# Patient Record
Sex: Male | Born: 1985 | Race: White | Hispanic: No | Marital: Married | Smoking: Never smoker
Health system: Southern US, Community
[De-identification: ages and names within clinical notes are randomized; demographics above are authoritative.]

## PROBLEM LIST (undated history)

## (undated) DIAGNOSIS — Z789 Other specified health status: Secondary | ICD-10-CM

---

## 2014-01-14 ENCOUNTER — Encounter (HOSPITAL_COMMUNITY): Payer: Self-pay | Admitting: Emergency Medicine

## 2014-01-14 ENCOUNTER — Encounter (HOSPITAL_COMMUNITY): Admitting: Certified Registered"

## 2014-01-14 ENCOUNTER — Inpatient Hospital Stay (HOSPITAL_COMMUNITY): Admitting: Certified Registered"

## 2014-01-14 ENCOUNTER — Inpatient Hospital Stay (HOSPITAL_COMMUNITY)
Admission: EM | Admit: 2014-01-14 | Discharge: 2014-01-22 | DRG: 957 | Disposition: A | Discharge status: Home / Self Care | Attending: Surgery | Admitting: Surgery

## 2014-01-14 ENCOUNTER — Inpatient Hospital Stay (HOSPITAL_COMMUNITY)

## 2014-01-14 ENCOUNTER — Emergency Department (HOSPITAL_COMMUNITY)

## 2014-01-14 ENCOUNTER — Encounter (HOSPITAL_COMMUNITY): Admission: EM | Disposition: A | Discharge status: Home / Self Care | Payer: Self-pay | Source: Home / Self Care

## 2014-01-14 DIAGNOSIS — S2502XA Major laceration of thoracic aorta, initial encounter: Secondary | ICD-10-CM

## 2014-01-14 DIAGNOSIS — R079 Chest pain, unspecified: Secondary | ICD-10-CM | POA: Diagnosis present

## 2014-01-14 DIAGNOSIS — S1093XA Contusion of unspecified part of neck, initial encounter: Secondary | ICD-10-CM

## 2014-01-14 DIAGNOSIS — S27329A Contusion of lung, unspecified, initial encounter: Secondary | ICD-10-CM | POA: Diagnosis present

## 2014-01-14 DIAGNOSIS — S2500XA Unspecified injury of thoracic aorta, initial encounter: Secondary | ICD-10-CM | POA: Diagnosis present

## 2014-01-14 DIAGNOSIS — S2242XA Multiple fractures of ribs, left side, initial encounter for closed fracture: Secondary | ICD-10-CM | POA: Diagnosis present

## 2014-01-14 DIAGNOSIS — J939 Pneumothorax, unspecified: Secondary | ICD-10-CM

## 2014-01-14 DIAGNOSIS — S27819A Unspecified injury of esophagus (thoracic part), initial encounter: Secondary | ICD-10-CM | POA: Diagnosis present

## 2014-01-14 DIAGNOSIS — S025XXA Fracture of tooth (traumatic), initial encounter for closed fracture: Secondary | ICD-10-CM | POA: Diagnosis present

## 2014-01-14 DIAGNOSIS — D62 Acute posthemorrhagic anemia: Secondary | ICD-10-CM | POA: Diagnosis present

## 2014-01-14 DIAGNOSIS — S22009A Unspecified fracture of unspecified thoracic vertebra, initial encounter for closed fracture: Secondary | ICD-10-CM | POA: Diagnosis present

## 2014-01-14 DIAGNOSIS — S0003XA Contusion of scalp, initial encounter: Secondary | ICD-10-CM | POA: Diagnosis present

## 2014-01-14 DIAGNOSIS — S272XXA Traumatic hemopneumothorax, initial encounter: Secondary | ICD-10-CM | POA: Diagnosis present

## 2014-01-14 DIAGNOSIS — G9340 Encephalopathy, unspecified: Secondary | ICD-10-CM | POA: Diagnosis present

## 2014-01-14 DIAGNOSIS — R000 Tachycardia, unspecified: Secondary | ICD-10-CM | POA: Diagnosis present

## 2014-01-14 DIAGNOSIS — I71019 Dissection of thoracic aorta, unspecified: Secondary | ICD-10-CM

## 2014-01-14 DIAGNOSIS — S4992XA Unspecified injury of left shoulder and upper arm, initial encounter: Secondary | ICD-10-CM | POA: Diagnosis present

## 2014-01-14 DIAGNOSIS — J9383 Other pneumothorax: Secondary | ICD-10-CM | POA: Diagnosis present

## 2014-01-14 DIAGNOSIS — I7101 Dissection of thoracic aorta: Secondary | ICD-10-CM

## 2014-01-14 DIAGNOSIS — S0083XA Contusion of other part of head, initial encounter: Secondary | ICD-10-CM | POA: Diagnosis present

## 2014-01-14 DIAGNOSIS — S3500XA Unspecified injury of abdominal aorta, initial encounter: Secondary | ICD-10-CM | POA: Diagnosis present

## 2014-01-14 DIAGNOSIS — S2249XA Multiple fractures of ribs, unspecified side, initial encounter for closed fracture: Secondary | ICD-10-CM | POA: Diagnosis present

## 2014-01-14 DIAGNOSIS — J90 Pleural effusion, not elsewhere classified: Secondary | ICD-10-CM | POA: Diagnosis present

## 2014-01-14 DIAGNOSIS — IMO0002 Reserved for concepts with insufficient information to code with codable children: Secondary | ICD-10-CM | POA: Diagnosis present

## 2014-01-14 DIAGNOSIS — S27321A Contusion of lung, unilateral, initial encounter: Secondary | ICD-10-CM | POA: Diagnosis present

## 2014-01-14 HISTORY — PX: ENDOVASCULAR STENT INSERTION: SHX5161

## 2014-01-14 HISTORY — DX: Other specified health status: Z78.9

## 2014-01-14 HISTORY — PX: ESOPHAGOGASTRODUODENOSCOPY: SHX5428

## 2014-01-14 LAB — URINALYSIS, ROUTINE W REFLEX MICROSCOPIC
Bilirubin Urine: NEGATIVE
Glucose, UA: NEGATIVE mg/dL
Ketones, ur: NEGATIVE mg/dL
LEUKOCYTES UA: NEGATIVE
NITRITE: NEGATIVE
Protein, ur: 30 mg/dL — AB
SPECIFIC GRAVITY, URINE: 1.042 — AB (ref 1.005–1.030)
UROBILINOGEN UA: 0.2 mg/dL (ref 0.0–1.0)
pH: 6 (ref 5.0–8.0)

## 2014-01-14 LAB — CBC
HCT: 39.5 % (ref 39.0–52.0)
HCT: 31.9 % — ABNORMAL LOW (ref 39.0–52.0)
HEMOGLOBIN: 11.2 g/dL — AB (ref 13.0–17.0)
Hemoglobin: 13.9 g/dL (ref 13.0–17.0)
MCH: 29.6 pg (ref 26.0–34.0)
MCH: 29.7 pg (ref 26.0–34.0)
MCHC: 35.2 g/dL (ref 30.0–36.0)
MCHC: 35.1 g/dL (ref 30.0–36.0)
MCV: 84.2 fL (ref 78.0–100.0)
MCV: 84.6 fL (ref 78.0–100.0)
PLATELETS: 161 10*3/uL (ref 150–400)
Platelets: 206 10*3/uL (ref 150–400)
RBC: 4.69 MIL/uL (ref 4.22–5.81)
RBC: 3.77 MIL/uL — AB (ref 4.22–5.81)
RDW: 12.2 % (ref 11.5–15.5)
RDW: 12.2 % (ref 11.5–15.5)
WBC: 13.3 10*3/uL — ABNORMAL HIGH (ref 4.0–10.5)
WBC: 18.7 10*3/uL — ABNORMAL HIGH (ref 4.0–10.5)

## 2014-01-14 LAB — POCT I-STAT 7, (LYTES, BLD GAS, ICA,H+H)
Bicarbonate: 25.7 meq/L — ABNORMAL HIGH (ref 20.0–24.0)
CALCIUM ION: 1.19 mmol/L (ref 1.12–1.23)
HEMATOCRIT: 30 % — AB (ref 39.0–52.0)
Hemoglobin: 10.2 g/dL — ABNORMAL LOW (ref 13.0–17.0)
O2 Saturation: 100 %
PH ART: 7.361 (ref 7.350–7.450)
Potassium: 4.6 mEq/L (ref 3.7–5.3)
SODIUM: 137 meq/L (ref 137–147)
TCO2: 27 mmol/L (ref 0–100)
pCO2 arterial: 45.3 mmHg — ABNORMAL HIGH (ref 35.0–45.0)
pO2, Arterial: 502 mmHg — ABNORMAL HIGH (ref 80.0–100.0)

## 2014-01-14 LAB — URINE MICROSCOPIC-ADD ON

## 2014-01-14 LAB — ABO/RH: ABO/RH(D): A POS

## 2014-01-14 LAB — COMPREHENSIVE METABOLIC PANEL
ALT: 78 U/L — ABNORMAL HIGH (ref 0–53)
AST: 66 U/L — ABNORMAL HIGH (ref 0–37)
Albumin: 3.7 g/dL (ref 3.5–5.2)
Alkaline Phosphatase: 48 U/L (ref 39–117)
Anion gap: 18 — ABNORMAL HIGH (ref 5–15)
BUN: 20 mg/dL (ref 6–23)
CO2: 22 mEq/L (ref 19–32)
Calcium: 9.2 mg/dL (ref 8.4–10.5)
Chloride: 99 mEq/L (ref 96–112)
Creatinine, Ser: 1.21 mg/dL (ref 0.50–1.35)
GFR calc Af Amer: >90 mL/min (ref >90)
GFR calc non Af Amer: 81 mL/min — ABNORMAL LOW (ref >90)
Glucose, Bld: 159 mg/dL — ABNORMAL HIGH (ref 70–99)
Potassium: 3.1 mEq/L — ABNORMAL LOW (ref 3.7–5.3)
Sodium: 139 mEq/L (ref 137–147)
Total Bilirubin: 0.3 mg/dL (ref 0.3–1.2)
Total Protein: 6.5 g/dL (ref 6.0–8.3)

## 2014-01-14 LAB — BASIC METABOLIC PANEL
Anion gap: 11 (ref 5–15)
BUN: 20 mg/dL (ref 6–23)
CO2: 24 mEq/L (ref 19–32)
Calcium: 8.2 mg/dL — ABNORMAL LOW (ref 8.4–10.5)
Chloride: 102 mEq/L (ref 96–112)
Creatinine, Ser: 1.05 mg/dL (ref 0.50–1.35)
GFR calc Af Amer: >90 mL/min (ref >90)
GFR calc non Af Amer: >90 mL/min (ref >90)
GLUCOSE: 162 mg/dL — AB (ref 70–99)
POTASSIUM: 4.6 meq/L (ref 3.7–5.3)
SODIUM: 137 meq/L (ref 137–147)

## 2014-01-14 LAB — PREPARE FRESH FROZEN PLASMA

## 2014-01-14 LAB — PROTIME-INR
INR: 1.1 (ref 0.00–1.49)
INR: 1.29 (ref 0.00–1.49)
Prothrombin Time: 14.2 seconds (ref 11.6–15.2)
Prothrombin Time: 16.1 seconds — ABNORMAL HIGH (ref 11.6–15.2)

## 2014-01-14 LAB — I-STAT CG4 LACTIC ACID, ED: Lactic Acid, Venous: 5.66 mmol/L — ABNORMAL HIGH (ref 0.5–2.2)

## 2014-01-14 LAB — MAGNESIUM: Magnesium: 1.6 mg/dL (ref 1.5–2.5)

## 2014-01-14 LAB — ETHANOL: Alcohol, Ethyl (B): <11 mg/dL (ref 0–11)

## 2014-01-14 LAB — APTT: aPTT: 32 seconds (ref 24–37)

## 2014-01-14 LAB — CDS SEROLOGY

## 2014-01-14 SURGERY — ENDOVASCULAR STENT GRAFT INSERTION
Anesthesia: General | Site: Mouth

## 2014-01-14 MED ORDER — ONDANSETRON 8 MG/NS 50 ML IVPB
8.0000 mg | Freq: Once | INTRAVENOUS | Status: DC
  Filled 2014-01-14: qty 8

## 2014-01-14 MED ORDER — ONDANSETRON HCL 4 MG/2ML IJ SOLN
4.0000 mg | Freq: Four times a day (QID) | INTRAMUSCULAR | Status: DC | PRN
  Administered 2014-01-20: 4 mg via INTRAVENOUS
  Filled 2014-01-14: qty 2

## 2014-01-14 MED ORDER — SODIUM CHLORIDE 0.9 % IV BOLUS (SEPSIS)
1000.0000 mL | Freq: Once | INTRAVENOUS | Status: AC
  Administered 2014-01-14: 1000 mL via INTRAVENOUS

## 2014-01-14 MED ORDER — LIDOCAINE HCL (CARDIAC) 20 MG/ML IV SOLN
INTRAVENOUS | Status: AC
  Filled 2014-01-14: qty 5

## 2014-01-14 MED ORDER — POTASSIUM CHLORIDE CRYS ER 20 MEQ PO TBCR: 20.0000 meq | EXTENDED_RELEASE_TABLET | Freq: Every day | ORAL | Status: DC | PRN

## 2014-01-14 MED ORDER — CEFAZOLIN SODIUM-DEXTROSE 2-3 GM-% IV SOLR
INTRAVENOUS | Status: AC
  Filled 2014-01-14: qty 50

## 2014-01-14 MED ORDER — VECURONIUM BROMIDE 10 MG IV SOLR
INTRAVENOUS | Status: DC | PRN
  Administered 2014-01-14: 2 mg via INTRAVENOUS

## 2014-01-14 MED ORDER — ETOMIDATE 2 MG/ML IV SOLN
INTRAVENOUS | Status: DC | PRN
  Administered 2014-01-14: 20 mg via INTRAVENOUS

## 2014-01-14 MED ORDER — IODIXANOL 320 MG/ML IV SOLN
INTRAVENOUS | Status: DC | PRN
  Administered 2014-01-14: 60.2 mL via INTRAVENOUS

## 2014-01-14 MED ORDER — ONDANSETRON HCL 4 MG/2ML IJ SOLN
INTRAMUSCULAR | Status: AC
  Administered 2014-01-14: 8 mg
  Filled 2014-01-14: qty 4

## 2014-01-14 MED ORDER — DEXTROSE 5 % IV SOLN
1.5000 g | Freq: Two times a day (BID) | INTRAVENOUS | Status: AC
  Administered 2014-01-14 – 2014-01-15 (×2): 1.5 g via INTRAVENOUS
  Filled 2014-01-14 (×2): qty 1.5

## 2014-01-14 MED ORDER — KCL IN DEXTROSE-NACL 40-5-0.45 MEQ/L-%-% IV SOLN
INTRAVENOUS | Status: DC
  Administered 2014-01-14: 22:00:00 via INTRAVENOUS
  Filled 2014-01-14 (×3): qty 1000

## 2014-01-14 MED ORDER — MUPIROCIN 2 % EX OINT: TOPICAL_OINTMENT | Freq: Two times a day (BID) | CUTANEOUS | Status: DC

## 2014-01-14 MED ORDER — IOHEXOL 300 MG/ML  SOLN: 100.0000 mL | Freq: Once | INTRAMUSCULAR | Status: AC | PRN

## 2014-01-14 MED ORDER — SODIUM CHLORIDE 0.9 % IV SOLN
INTRAVENOUS | Status: DC | PRN
  Administered 2014-01-14: 18:00:00 via INTRAVENOUS

## 2014-01-14 MED ORDER — HEPARIN SODIUM (PORCINE) 1000 UNIT/ML IJ SOLN
INTRAMUSCULAR | Status: AC
  Filled 2014-01-14: qty 1

## 2014-01-14 MED ORDER — GUAIFENESIN-DM 100-10 MG/5ML PO SYRP: 15.0000 mL | ORAL_SOLUTION | ORAL | Status: DC | PRN

## 2014-01-14 MED ORDER — ARTIFICIAL TEARS OP OINT
TOPICAL_OINTMENT | OPHTHALMIC | Status: AC
  Filled 2014-01-14: qty 3.5

## 2014-01-14 MED ORDER — FENTANYL CITRATE 0.05 MG/ML IJ SOLN
100.0000 ug | INTRAMUSCULAR | Status: DC | PRN
  Administered 2014-01-14 – 2014-01-17 (×16): 100 ug via INTRAVENOUS
  Filled 2014-01-14 (×14): qty 2

## 2014-01-14 MED ORDER — DOPAMINE-DEXTROSE 3.2-5 MG/ML-% IV SOLN: 3.0000 ug/kg/min | INTRAVENOUS | Status: DC

## 2014-01-14 MED ORDER — PROTAMINE SULFATE 10 MG/ML IV SOLN
INTRAVENOUS | Status: DC | PRN
  Administered 2014-01-14 (×3): 10 mg via INTRAVENOUS

## 2014-01-14 MED ORDER — ONDANSETRON HCL 4 MG PO TABS: 4.0000 mg | ORAL_TABLET | Freq: Four times a day (QID) | ORAL | Status: DC | PRN

## 2014-01-14 MED ORDER — ROCURONIUM BROMIDE 100 MG/10ML IV SOLN
INTRAVENOUS | Status: DC | PRN
  Administered 2014-01-14: 50 mg via INTRAVENOUS

## 2014-01-14 MED ORDER — MIDAZOLAM HCL 2 MG/2ML IJ SOLN
INTRAMUSCULAR | Status: AC
  Filled 2014-01-14: qty 2

## 2014-01-14 MED ORDER — ONDANSETRON HCL 4 MG/2ML IJ SOLN: 4.0000 mg | Freq: Four times a day (QID) | INTRAMUSCULAR | Status: DC | PRN

## 2014-01-14 MED ORDER — PHENOL 1.4 % MT LIQD: 1.0000 | OROMUCOSAL | Status: DC | PRN

## 2014-01-14 MED ORDER — SODIUM CHLORIDE 0.9 % IV SOLN: 500.0000 mL | Freq: Once | INTRAVENOUS | Status: AC | PRN

## 2014-01-14 MED ORDER — SODIUM CHLORIDE 0.9 % IV SOLN: INTRAVENOUS | Status: DC

## 2014-01-14 MED ORDER — LABETALOL HCL 5 MG/ML IV SOLN
10.0000 mg | INTRAVENOUS | Status: DC | PRN
  Filled 2014-01-14: qty 4

## 2014-01-14 MED ORDER — LABETALOL HCL 5 MG/ML IV SOLN
INTRAVENOUS | Status: DC | PRN
  Administered 2014-01-14: 5 mg via INTRAVENOUS

## 2014-01-14 MED ORDER — MAGNESIUM SULFATE 40 MG/ML IJ SOLN
2.0000 g | Freq: Every day | INTRAMUSCULAR | Status: AC | PRN
  Administered 2014-01-14: 2 g via INTRAVENOUS
  Filled 2014-01-14: qty 50

## 2014-01-14 MED ORDER — FENTANYL CITRATE 0.05 MG/ML IJ SOLN
INTRAMUSCULAR | Status: DC | PRN
  Administered 2014-01-14: 200 ug via INTRAVENOUS
  Administered 2014-01-14: 50 ug via INTRAVENOUS

## 2014-01-14 MED ORDER — TETANUS-DIPHTH-ACELL PERTUSSIS 5-2.5-18.5 LF-MCG/0.5 IM SUSP
0.5000 mL | Freq: Once | INTRAMUSCULAR | Status: AC
  Administered 2014-01-14: 0.5 mL via INTRAMUSCULAR
  Filled 2014-01-14: qty 0.5

## 2014-01-14 MED ORDER — NEOSTIGMINE METHYLSULFATE 10 MG/10ML IV SOLN
INTRAVENOUS | Status: DC | PRN
  Administered 2014-01-14: 4 mg via INTRAVENOUS

## 2014-01-14 MED ORDER — CEFAZOLIN SODIUM-DEXTROSE 2-3 GM-% IV SOLR
INTRAVENOUS | Status: DC | PRN
  Administered 2014-01-14: 2 g via INTRAVENOUS

## 2014-01-14 MED ORDER — ARTIFICIAL TEARS OP OINT
TOPICAL_OINTMENT | OPHTHALMIC | Status: DC | PRN
  Administered 2014-01-14: 1 via OPHTHALMIC

## 2014-01-14 MED ORDER — SUCCINYLCHOLINE CHLORIDE 20 MG/ML IJ SOLN
INTRAMUSCULAR | Status: DC | PRN
  Administered 2014-01-14: 120 mg via INTRAVENOUS

## 2014-01-14 MED ORDER — FENTANYL CITRATE 0.05 MG/ML IJ SOLN
100.0000 ug | Freq: Once | INTRAMUSCULAR | Status: AC
Start: 2014-01-14 — End: 2014-01-14
  Administered 2014-01-14: 100 ug via INTRAVENOUS

## 2014-01-14 MED ORDER — FENTANYL CITRATE 0.05 MG/ML IJ SOLN
INTRAMUSCULAR | Status: AC
  Filled 2014-01-14: qty 5

## 2014-01-14 MED ORDER — LACTATED RINGERS IV SOLN
INTRAVENOUS | Status: DC | PRN
  Administered 2014-01-14 (×2): via INTRAVENOUS

## 2014-01-14 MED ORDER — HEPARIN SODIUM (PORCINE) 1000 UNIT/ML IJ SOLN
INTRAMUSCULAR | Status: DC | PRN
  Administered 2014-01-14: 8 mL via INTRAVENOUS

## 2014-01-14 MED ORDER — DEXAMETHASONE SODIUM PHOSPHATE 4 MG/ML IJ SOLN
INTRAMUSCULAR | Status: DC | PRN
  Administered 2014-01-14: 8 mg via INTRAVENOUS

## 2014-01-14 MED ORDER — MIDAZOLAM HCL 2 MG/2ML IJ SOLN
2.0000 mg | Freq: Once | INTRAMUSCULAR | Status: AC
  Administered 2014-01-14: 2 mg via INTRAVENOUS

## 2014-01-14 MED ORDER — PANTOPRAZOLE SODIUM 40 MG PO TBEC
40.0000 mg | DELAYED_RELEASE_TABLET | Freq: Every day | ORAL | Status: DC
  Administered 2014-01-15: 40 mg via ORAL
  Filled 2014-01-14 (×2): qty 1

## 2014-01-14 MED ORDER — GLYCOPYRROLATE 0.2 MG/ML IJ SOLN
INTRAMUSCULAR | Status: DC | PRN
  Administered 2014-01-14: 0.6 mg via INTRAVENOUS

## 2014-01-14 MED ORDER — METOPROLOL TARTRATE 1 MG/ML IV SOLN: 2.0000 mg | INTRAVENOUS | Status: DC | PRN

## 2014-01-14 MED ORDER — FENTANYL CITRATE 0.05 MG/ML IJ SOLN
25.0000 ug | INTRAMUSCULAR | Status: DC | PRN
  Administered 2014-01-14: 25 ug via INTRAVENOUS
  Administered 2014-01-15: 100 ug via INTRAVENOUS
  Administered 2014-01-15: 25 ug via INTRAVENOUS
  Administered 2014-01-15: 50 ug via INTRAVENOUS
  Administered 2014-01-16: 100 ug via INTRAVENOUS
  Administered 2014-01-16: 50 ug via INTRAVENOUS
  Filled 2014-01-14 (×8): qty 2

## 2014-01-14 MED ORDER — PHENYLEPHRINE HCL 10 MG/ML IJ SOLN
10.0000 mg | INTRAVENOUS | Status: DC | PRN
  Administered 2014-01-14: 10 ug/min via INTRAVENOUS

## 2014-01-14 MED ORDER — 0.9 % SODIUM CHLORIDE (POUR BTL) OPTIME
TOPICAL | Status: DC | PRN
  Administered 2014-01-14: 1000 mL

## 2014-01-14 MED ORDER — LIDOCAINE HCL (CARDIAC) 20 MG/ML IV SOLN
INTRAVENOUS | Status: DC | PRN
  Administered 2014-01-14: 100 mg via INTRAVENOUS

## 2014-01-14 MED ORDER — ENOXAPARIN SODIUM 40 MG/0.4ML ~~LOC~~ SOLN
40.0000 mg | SUBCUTANEOUS | Status: DC
  Administered 2014-01-15 – 2014-01-17 (×3): 40 mg via SUBCUTANEOUS
  Filled 2014-01-14 (×4): qty 0.4

## 2014-01-14 MED ORDER — LACTATED RINGERS IV SOLN
INTRAVENOUS | Status: DC | PRN
  Administered 2014-01-14: 18:00:00 via INTRAVENOUS

## 2014-01-14 MED ORDER — HYDRALAZINE HCL 20 MG/ML IJ SOLN: 10.0000 mg | INTRAMUSCULAR | Status: DC | PRN

## 2014-01-14 MED ORDER — ETOMIDATE 2 MG/ML IV SOLN
INTRAVENOUS | Status: AC
  Filled 2014-01-14: qty 10

## 2014-01-14 MED ORDER — SODIUM CHLORIDE 0.9 % IR SOLN
Status: DC | PRN
  Administered 2014-01-14: 19:00:00

## 2014-01-14 MED ORDER — MORPHINE SULFATE 2 MG/ML IJ SOLN
2.0000 mg | INTRAMUSCULAR | Status: DC | PRN
  Administered 2014-01-15: 4 mg via INTRAVENOUS
  Administered 2014-01-15: 2 mg via INTRAVENOUS
  Filled 2014-01-14: qty 1
  Filled 2014-01-14: qty 2

## 2014-01-14 MED ORDER — ONDANSETRON HCL 4 MG/2ML IJ SOLN
INTRAMUSCULAR | Status: DC | PRN
  Administered 2014-01-14: 4 mg via INTRAVENOUS

## 2014-01-14 MED ORDER — PANTOPRAZOLE SODIUM 40 MG IV SOLR
40.0000 mg | Freq: Every day | INTRAVENOUS | Status: DC
  Administered 2014-01-14 – 2014-01-16 (×2): 40 mg via INTRAVENOUS
  Filled 2014-01-14 (×4): qty 40

## 2014-01-14 SURGICAL SUPPLY — 66 items
BAG DECANTER FOR FLEXI CONT (MISCELLANEOUS) IMPLANT
BALLN CODA OCL 2-9.0-35-120-3 (BALLOONS)
BALLOON COD OCL 2-9.0-35-120-3 (BALLOONS) IMPLANT
CANISTER SUCTION 2500CC (MISCELLANEOUS) ×4 IMPLANT
CATH ACCU-VU SIZ PIG 5F 100CM (CATHETERS) ×4 IMPLANT
CATH BALLN TRILOBE 26-42 (BALLOONS) ×4 IMPLANT
CLIP TI MEDIUM 24 (CLIP) IMPLANT
CLIP TI WIDE RED SMALL 24 (CLIP) IMPLANT
COVER MAYO STAND STRL (DRAPES) ×4 IMPLANT
COVER SURGICAL LIGHT HANDLE (MISCELLANEOUS) ×4 IMPLANT
DERMABOND ADHESIVE PROPEN (GAUZE/BANDAGES/DRESSINGS) ×2
DERMABOND ADVANCED (GAUZE/BANDAGES/DRESSINGS) ×2
DERMABOND ADVANCED .7 DNX12 (GAUZE/BANDAGES/DRESSINGS) ×2 IMPLANT
DERMABOND ADVANCED .7 DNX6 (GAUZE/BANDAGES/DRESSINGS) ×2 IMPLANT
DEVICE CLOSURE PERCLS PRGLD 6F (VASCULAR PRODUCTS) ×4 IMPLANT
DRAIN CHANNEL 10F 3/8 F FF (DRAIN) IMPLANT
DRAIN CHANNEL 10M FLAT 3/4 FLT (DRAIN) IMPLANT
DRAPE TABLE COVER HEAVY DUTY (DRAPES) ×4 IMPLANT
ELECT CAUTERY BLADE 6.4 (BLADE) ×4 IMPLANT
ELECT REM PT RETURN 9FT ADLT (ELECTROSURGICAL) ×8
ELECTRODE REM PT RTRN 9FT ADLT (ELECTROSURGICAL) ×4 IMPLANT
ENDOPROSTHESIS THORAC 26X26X10 (Endovascular Graft) ×2 IMPLANT
ENDOPROTH THORACIC 26X26X10 (Endovascular Graft) ×4 IMPLANT
EVACUATOR 3/16  PVC DRAIN (DRAIN)
EVACUATOR 3/16 PVC DRAIN (DRAIN) IMPLANT
EVACUATOR SILICONE 100CC (DRAIN) IMPLANT
GAUZE SPONGE 2X2 8PLY STRL LF (GAUZE/BANDAGES/DRESSINGS) ×2 IMPLANT
GLOVE BIO SURGEON STRL SZ 6.5 (GLOVE) ×3 IMPLANT
GLOVE BIO SURGEON STRL SZ7 (GLOVE) ×4 IMPLANT
GLOVE BIO SURGEONS STRL SZ 6.5 (GLOVE) ×1
GLOVE BIOGEL PI IND STRL 7.5 (GLOVE) ×2 IMPLANT
GLOVE BIOGEL PI INDICATOR 7.5 (GLOVE) ×2
GOWN STRL REUS W/ TWL LRG LVL3 (GOWN DISPOSABLE) ×6 IMPLANT
GOWN STRL REUS W/TWL LRG LVL3 (GOWN DISPOSABLE) ×6
KIT BASIN OR (CUSTOM PROCEDURE TRAY) ×4 IMPLANT
KIT ROOM TURNOVER OR (KITS) ×4 IMPLANT
NEEDLE PERC 18GX7CM (NEEDLE) ×8 IMPLANT
NS IRRIG 1000ML POUR BTL (IV SOLUTION) ×8 IMPLANT
PACK AORTA (CUSTOM PROCEDURE TRAY) ×4 IMPLANT
PAD ARMBOARD 7.5X6 YLW CONV (MISCELLANEOUS) ×8 IMPLANT
PENCIL BUTTON HOLSTER BLD 10FT (ELECTRODE) IMPLANT
PERCLOSE PROGLIDE 6F (VASCULAR PRODUCTS) ×8
PROTECTION STATION PRESSURIZED (MISCELLANEOUS) ×4
SHEATH DRYSEAL GORE 20X28 (SHEATH) ×4 IMPLANT
SPONGE GAUZE 2X2 STER 10/PKG (GAUZE/BANDAGES/DRESSINGS) ×2
STAPLER VISISTAT 35W (STAPLE) IMPLANT
STATION PROTECTION PRESSURIZED (MISCELLANEOUS) ×2 IMPLANT
STOPCOCK MORSE 400PSI 3WAY (MISCELLANEOUS) ×4 IMPLANT
SUT ETHILON 3 0 PS 1 (SUTURE) IMPLANT
SUT MNCRL AB 4-0 PS2 18 (SUTURE) ×12 IMPLANT
SUT PROLENE 5 0 C 1 24 (SUTURE) IMPLANT
SUT VIC AB 2-0 CTX 36 (SUTURE) IMPLANT
SUT VIC AB 3-0 SH 27 (SUTURE)
SUT VIC AB 3-0 SH 27X BRD (SUTURE) IMPLANT
SYR 20CC LL (SYRINGE) ×8 IMPLANT
SYR 30ML LL (SYRINGE) IMPLANT
SYR MEDRAD MARK V 150ML (SYRINGE) ×4 IMPLANT
SYRINGE 10CC LL (SYRINGE) ×12 IMPLANT
TAPE CLOTH SURG 4X10 WHT LF (GAUZE/BANDAGES/DRESSINGS) ×4 IMPLANT
TOWEL OR 17X24 6PK STRL BLUE (TOWEL DISPOSABLE) ×8 IMPLANT
TOWEL OR 17X26 10 PK STRL BLUE (TOWEL DISPOSABLE) ×8 IMPLANT
TRAY FOLEY CATH 16FRSI W/METER (SET/KITS/TRAYS/PACK) ×4 IMPLANT
TUBING HIGH PRESSURE 120CM (CONNECTOR) ×4 IMPLANT
WATER STERILE IRR 1000ML POUR (IV SOLUTION) ×4 IMPLANT
WIRE BENTSON .035X145CM (WIRE) ×4 IMPLANT
WIRE STIFF LUNDERQUIST 260CM (WIRE) ×4 IMPLANT
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
## 2014-01-14 NOTE — ED Notes (Signed)
Pain med given.  Or has called   They are ready for this pt.
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
## 2014-01-14 NOTE — ED Notes (Addendum)
Dr gerhardt back in to see.  Endoscopy permit signed for following surgery
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
## 2014-01-14 NOTE — ED Notes (Signed)
Anesthesia here to insert bi-lateral art lines
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
## 2014-01-14 NOTE — Anesthesia Postprocedure Evaluation (Signed)
  Anesthesia Post-op Note  Patient: Kenneth Franco  Procedure(s) Performed: Procedure(s) with comments: ENDOVASCULAR STENT GRAFT INSERTION (N/A) - Thorasic Endovascular Stent. ESOPHAGOGASTRODUODENOSCOPY (EGD) (N/A)  Patient Location: PACU  Anesthesia Type:General  Level of Consciousness: awake and alert   Airway and Oxygen Therapy: Patient Spontanous Breathing  Post-op Pain: none  Post-op Assessment: Post-op Vital signs reviewed, Patient's Cardiovascular Status Stable and Respiratory Function Stable  Post-op Vital Signs: Reviewed  Filed Vitals:   01/14/14 2110  BP:   Pulse:   Temp: 37.1 C  Resp:     Complications: No apparent anesthesia complications
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
## 2014-01-14 NOTE — ED Notes (Signed)
Primary RN, Myrtha Mantis, has remained with the pt for the duration of his stay to this point, bedside report given to Herbert Deaner, RN.  RN stayed with pt to and from CT and monitored continuously

## 2014-01-14 NOTE — ED Notes (Signed)
Anesthesia coming to insert art line.

## 2014-01-14 NOTE — Anesthesia Procedure Notes (Addendum)
Procedure Name: Intubation Date/Time: 01/14/2014 6:06 PM Performed by: Edmonia Caprio Pre-anesthesia Checklist: Patient identified, Emergency Drugs available, Suction available, Patient being monitored and Timeout performed Patient Re-evaluated:Patient Re-evaluated prior to inductionOxygen Delivery Method: Circle system utilized Preoxygenation: Pre-oxygenation with 100% oxygen Intubation Type: Rapid sequence, IV induction and Cricoid Pressure applied Laryngoscope Size: Miller and 2 Grade View: Grade II Tube type: Subglottic suction tube Number of attempts: 2 Placement Confirmation: ETT inserted through vocal cords under direct vision,  positive ETCO2,  CO2 detector and breath sounds checked- equal and bilateral Secured at: 24 cm Tube secured with: Tape Dental Injury: Teeth and Oropharynx as per pre-operative assessment and Bloody posterior oropharynx       The patient was identified and consent obtained.  TO was performed, and full barrier precautions were used.  The skin was anesthetized with lidocaine.  Once the vein was located with the 22 ga., the wire was inserted into the vein. The insertion site was dilated and the CVL was carefully inserted and sutured in place. The patient tolerated the procedure well.Ultrasound guidance: relevant anatomy identified, needle position confirmed, vessel patent, wire seen inside the vessel.  Images printed for the medical record.  Start: 1713 End: 1723 J. Claybon Jabs, MD

## 2014-01-14 NOTE — Progress Notes (Signed)
   Tried to update the patient's wife, but no phone number is listed.  Reportedly patient's wife is enroute to [place].  Will try to talk to her tomorrow once a phone number is available.  Dr. Hart Rochester is on call for our group tonight.  Leonides Sake, MD [facility] Office: [phone] Pager: [phone]  01/14/2014, 7:50 PM

## 2014-01-14 NOTE — ED Notes (Signed)
Dr Jesusita Oka singer at bedside to insert central line

## 2014-01-14 NOTE — ED Notes (Signed)
Lactic acid results given to Dr. Kohut 
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
## 2014-01-14 NOTE — ED Notes (Signed)
Or permit signed by the pt

## 2014-01-14 NOTE — ED Notes (Signed)
Dr Imogene Burn at the bedside

## 2014-01-14 NOTE — Brief Op Note (Signed)
      [address]       [name] [address]             [phone]     01/14/2014  7:54 PM  PATIENT:  Kenneth Franco  28 y.o. male  PRE-OPERATIVE DIAGNOSIS:  Descending Aortic Tear  POST-OPERATIVE DIAGNOSIS:  Descending Aortic Tear  PROCEDURE:  Procedure(s) with comments: ENDOVASCULAR STENT GRAFT INSERTION (N/A) - Thorasic Endovascular Stent. ESOPHAGOGASTRODUODENOSCOPY (EGD) (N/A) PROCEDURE:  1. Cannulation of right common femoral artery under ultrasound guidance 2. Preclose repair of right common femoral artery  3. Placement of catheter in aorta 4. Arch aortogram 5. Endovascular repair of descending thoracic aorta, involving coverage of left subclavian artery (26 mm x 26 mm x 10 cm) 6. Radiology S&I 7. Upper endoscopy (done by Dr. Tyrone Sage) PRE-OPERATIVE DIAGNOSIS: Traumatic transection of descending thoracic aorta  POST-OPERATIVE DIAGNOSIS: same as above /no evidence of esophageal injury  CO-SURGEON: Leonides Sake, MD; Sheliah Plane, MD  ANESTHESIA: general  ESTIMATED BLOOD LOSS: 100 cc  FINDING(S):  1. Descending thoracic aortic transection distal to subclavian artery  2. Successful exclusion of injured segment with continued patency of innominate artery and left common carotid artery  3. Coverage of left subclavian artery with no type I endoleak evident: 40 mm Hg pressure still present in left radial arterial line 4. No type II endoleak evident  5. Palpable dorsalis pedis pulse in right foot   ANESTHESIA:   general  EBL:  Total I/O In: 1700 [I.V.:1700] Out: 400 [Urine:400]  BLOOD ADMINISTERED:none  DRAINS: none   LOCAL MEDICATIONS USED:  NONE  SPECIMEN:  No Specimen  DISPOSITION OF SPECIMEN:  N/A  COUNTS:  YES  DICTATION: .Dragon Dictation  PLAN OF CARE: Admit to inpatient   PATIENT DISPOSITION:  ICU - extubated and stable.   Delay start of Pharmacological VTE agent (>24hrs) due to surgical blood loss or risk of bleeding: yes

## 2014-01-14 NOTE — ED Notes (Signed)
Dr Hart Rochester at the bedside

## 2014-01-14 NOTE — Transfer of Care (Signed)
Immediate Anesthesia Transfer of Care Note  Patient: Kenneth Franco  Procedure(s) Performed: Procedure(s) with comments: ENDOVASCULAR STENT GRAFT INSERTION (N/A) - Thorasic Endovascular Stent. ESOPHAGOGASTRODUODENOSCOPY (EGD) (N/A)  Patient Location: PACU  Anesthesia Type:General  Level of Consciousness: awake and alert   Airway & Oxygen Therapy: Patient Spontanous Breathing and Patient connected to nasal cannula oxygen  Post-op Assessment: Report given to PACU RN, Post -op Vital signs reviewed and stable and Patient moving all extremities  Post vital signs: Reviewed and stable  Complications: No apparent anesthesia complications
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
## 2014-01-15 ENCOUNTER — Encounter (HOSPITAL_COMMUNITY): Payer: Self-pay | Admitting: Vascular Surgery

## 2014-01-15 LAB — BASIC METABOLIC PANEL
Anion gap: 17 — ABNORMAL HIGH (ref 5–15)
BUN: 16 mg/dL (ref 6–23)
CALCIUM: 8.4 mg/dL (ref 8.4–10.5)
CO2: 22 mEq/L (ref 19–32)
Chloride: 99 mEq/L (ref 96–112)
Creatinine, Ser: 0.98 mg/dL (ref 0.50–1.35)
GLUCOSE: 186 mg/dL — AB (ref 70–99)
Potassium: 4.9 mEq/L (ref 3.7–5.3)
Sodium: 138 mEq/L (ref 137–147)

## 2014-01-15 LAB — CBC
HCT: 31 % — ABNORMAL LOW (ref 39.0–52.0)
HEMATOCRIT: 28.1 % — AB (ref 39.0–52.0)
Hemoglobin: 10.7 g/dL — ABNORMAL LOW (ref 13.0–17.0)
Hemoglobin: 10 g/dL — ABNORMAL LOW (ref 13.0–17.0)
MCH: 28.8 pg (ref 26.0–34.0)
MCH: 29.6 pg (ref 26.0–34.0)
MCHC: 34.5 g/dL (ref 30.0–36.0)
MCHC: 35.6 g/dL (ref 30.0–36.0)
MCV: 83.3 fL (ref 78.0–100.0)
MCV: 83.1 fL (ref 78.0–100.0)
Platelets: 124 10*3/uL — ABNORMAL LOW (ref 150–400)
Platelets: 110 10*3/uL — ABNORMAL LOW (ref 150–400)
RBC: 3.72 MIL/uL — ABNORMAL LOW (ref 4.22–5.81)
RBC: 3.38 MIL/uL — ABNORMAL LOW (ref 4.22–5.81)
RDW: 12.1 % (ref 11.5–15.5)
RDW: 12.3 % (ref 11.5–15.5)
WBC: 10.4 10*3/uL (ref 4.0–10.5)
WBC: 11.9 10*3/uL — ABNORMAL HIGH (ref 4.0–10.5)

## 2014-01-15 LAB — TYPE AND SCREEN
ABO/RH(D): A POS
ABO/RH(D): A POS
Antibody Screen: NEGATIVE
Antibody Screen: NEGATIVE
Unit division: 0
Unit division: 0
Unit division: 0
Unit division: 0
Unit division: 0
Unit division: 0

## 2014-01-15 LAB — BLOOD PRODUCT ORDER (VERBAL) VERIFICATION

## 2014-01-15 LAB — URINE CULTURE
COLONY COUNT: NO GROWTH
Culture: NO GROWTH

## 2014-01-15 LAB — MRSA PCR SCREENING: MRSA BY PCR: NEGATIVE

## 2014-01-15 MED ORDER — TRAMADOL HCL 50 MG PO TABS
100.0000 mg | ORAL_TABLET | Freq: Four times a day (QID) | ORAL | Status: DC | PRN
  Administered 2014-01-16 (×3): 100 mg via ORAL
  Filled 2014-01-15 (×3): qty 2

## 2014-01-15 MED ORDER — DEXTROSE-NACL 5-0.45 % IV SOLN
INTRAVENOUS | Status: DC
  Administered 2014-01-15 – 2014-01-16 (×2): via INTRAVENOUS
  Filled 2014-01-15 (×5): qty 1000

## 2014-01-15 MED ORDER — METOPROLOL TARTRATE 12.5 MG HALF TABLET: 12.5000 mg | ORAL_TABLET | Freq: Two times a day (BID) | ORAL | Status: DC

## 2014-01-15 MED ORDER — METOPROLOL TARTRATE 12.5 MG HALF TABLET
12.5000 mg | ORAL_TABLET | Freq: Two times a day (BID) | ORAL | Status: DC
  Administered 2014-01-15 – 2014-01-22 (×15): 12.5 mg via ORAL
  Filled 2014-01-15 (×20): qty 1
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
## 2014-01-15 NOTE — Progress Notes (Signed)
UR completed.  Trace Wirick, RN BSN MHA CCM Trauma/Neuro ICU Case Manager [phone]  

## 2014-01-15 NOTE — Progress Notes (Signed)
Patient ID: Kenneth Franco, male   DOB: 26-Jun-1985, 28 y.o.   MRN: [id number] TCTS DAILY ICU PROGRESS NOTE                   [address]            [name] [address]          [phone]   1 Day Post-Op Procedure(s) (LRB): ENDOVASCULAR STENT GRAFT INSERTION (N/A) ESOPHAGOGASTRODUODENOSCOPY (EGD) (N/A)  Total Length of Stay:  LOS: 1 day   Subjective: Alert neuro intact, normal feeling lower extremities  Objective: Vital signs in last 24 hours: Temp:  [97.6 F (36.4 C)-100 F (37.8 C)] 97.6 F (36.4 C) (09/17 0400) Pulse Rate:  [75-117] 93 (09/17 0700) Cardiac Rhythm:  [-] Normal sinus rhythm (09/17 0400) Resp:  [9-100] 29 (09/17 0700) BP: (100-154)/(56-90) 136/64 mmHg (09/17 0700) SpO2:  [94 %-100 %] 97 % (09/17 0700) Arterial Line BP: (91-178)/(41-84) 171/67 mmHg (09/17 0700) Weight:  [210 lb (95.255 kg)-211 lb 9.6 oz (95.981 kg)] 211 lb 9.6 oz (95.981 kg) (09/16 2300)  Filed Weights   01/14/14 1445 01/14/14 2300  Weight: 210 lb (95.255 kg) 211 lb 9.6 oz (95.981 kg)    Weight change:    Hemodynamic parameters for last 24 hours:    Intake/Output from previous day: 09/16 0701 - 09/17 0700 In: 4581.7 [I.V.:4481.7; IV Piggyback:100] Out: 1415 [Urine:1370; Chest Tube:45]  Intake/Output this shift:    Current Meds: Scheduled Meds: . cefUROXime (ZINACEF)  IV  1.5 g Intravenous Q12H  . enoxaparin (LOVENOX) injection  40 mg Subcutaneous Q24H  . pantoprazole  40 mg Oral Daily   Or  . pantoprazole (PROTONIX) IV  40 mg Intravenous Daily   Continuous Infusions: . dextrose 5 % and 0.45% NaCl 1,000 mL infusion     PRN Meds:.fentaNYL, fentaNYL, guaiFENesin-dextromethorphan, hydrALAZINE, labetalol, metoprolol, morphine injection, ondansetron (ZOFRAN) IV, ondansetron, ondansetron, phenol, potassium chloride, traMADol  General appearance: alert, cooperative and no distress Neurologic: intact Heart: regular rate and rhythm, S1, S2 normal, no murmur,  click, rub or gallop Lungs: diminished breath sounds LLL and LUL Abdomen: soft, non-tender; bowel sounds normal; no masses,  no organomegaly Extremities: extremities normal, atraumatic, no cyanosis or edema and Homans sign is negative, no sign of DVT Wound: right groin with out hematoma  Lab Results: CBC: Recent Labs  01/14/14 2000 01/15/14 0410  WBC 18.7* 10.4  HGB 11.2* 10.7*  HCT 31.9* 31.0*  PLT 161 124*   BMET:  Recent Labs  01/14/14 2000 01/15/14 0410  NA 137 138  K 4.6 4.9  CL 102 99  CO2 24 22  GLUCOSE 162* 186*  BUN 20 16  CREATININE 1.05 0.98  CALCIUM 8.2* 8.4    PT/INR:  Recent Labs  01/14/14 2000  LABPROT 16.1*  INR 1.29     Assessment/Plan: S/P Procedure(s) (LRB): ENDOVASCULAR STENT GRAFT INSERTION (N/A) ESOPHAGOGASTRODUODENOSCOPY (EGD) (N/A) Stable after stent Start low dose beta blocker- short term     Lorielle Boehning B 01/15/2014 8:29 AM
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
## 2014-01-15 NOTE — Progress Notes (Addendum)
   Daily Progress Note  Assessment/Planning: POD #1 s/p TEVAR for DTA transection   TEVAR clinically appears patent  AM pCXR pending.   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  [facility] is probably nears [facility] MTF that provides this capability  Will continue to follow  Subjective  - 1 Day Post-Op  Sore all over  Objective Filed Vitals:   01/15/14 0400 01/15/14 0500 01/15/14 0600 01/15/14 0700  BP: 136/64 129/56 130/62 136/64  Pulse: 89 86 86 93  Temp: 97.6 F (36.4 C)     TempSrc: Oral     Resp: Height:      Weight:      SpO2: 100% 98% 98% 97%    Intake/Output Summary (Last 24 hours) at 01/15/14 0825 Last data filed at 01/15/14 0600  Gross per 24 hour  Intake 4581.67 ml  Output   1415 ml  Net 3166.67 ml   NEURO able to move all extremities spont., CN intact PULM  BLL , L chest tube in place, AM pCXR pending CV  RRR GI  soft, NTND VASC  R groin soft without hematoma, R DP easily palpable, CR in L hand <2 sec, well perfused hand  Laboratory CBC    Component Value Date/Time   WBC 10.4 01/15/2014 0410   HGB 10.7* 01/15/2014 0410   HCT 31.0* 01/15/2014 0410   PLT 124* 01/15/2014 0410    BMET    Component Value Date/Time   NA 138 01/15/2014 0410   K 4.9 01/15/2014 0410   CL 99 01/15/2014 0410   CO2 22 01/15/2014 0410   GLUCOSE 186* 01/15/2014 0410   BUN 16 01/15/2014 0410   CREATININE 0.98 01/15/2014 0410   CALCIUM 8.4 01/15/2014 0410   GFRNONAA >90 01/15/2014 0410   GFRAA >90 01/15/2014 0410    Leonides Sake, MD [facility] Office: [phone] Pager: [phone]  01/15/2014, 8:25 AM

## 2014-01-16 ENCOUNTER — Inpatient Hospital Stay (HOSPITAL_COMMUNITY)

## 2014-01-16 ENCOUNTER — Encounter (HOSPITAL_COMMUNITY): Payer: Self-pay | Admitting: Infectious Diseases

## 2014-01-16 LAB — CBC
HCT: 26.2 % — ABNORMAL LOW (ref 39.0–52.0)
HCT: 24.8 % — ABNORMAL LOW (ref 39.0–52.0)
Hemoglobin: 9.3 g/dL — ABNORMAL LOW (ref 13.0–17.0)
Hemoglobin: 9 g/dL — ABNORMAL LOW (ref 13.0–17.0)
MCH: 29.7 pg (ref 26.0–34.0)
MCH: 30.9 pg (ref 26.0–34.0)
MCHC: 35.5 g/dL (ref 30.0–36.0)
MCHC: 36.3 g/dL — AB (ref 30.0–36.0)
MCV: 83.7 fL (ref 78.0–100.0)
MCV: 85.2 fL (ref 78.0–100.0)
PLATELETS: 169 10*3/uL (ref 150–400)
Platelets: 103 10*3/uL — ABNORMAL LOW (ref 150–400)
RBC: 3.13 MIL/uL — ABNORMAL LOW (ref 4.22–5.81)
RBC: 2.91 MIL/uL — ABNORMAL LOW (ref 4.22–5.81)
RDW: 12.2 % (ref 11.5–15.5)
RDW: 12.2 % (ref 11.5–15.5)
WBC: 10.5 10*3/uL (ref 4.0–10.5)
WBC: 10.4 10*3/uL (ref 4.0–10.5)

## 2014-01-16 LAB — BASIC METABOLIC PANEL
Anion gap: 8 (ref 5–15)
BUN: 10 mg/dL (ref 6–23)
CO2: 31 mEq/L (ref 19–32)
CREATININE: 0.83 mg/dL (ref 0.50–1.35)
Calcium: 8.8 mg/dL (ref 8.4–10.5)
Chloride: 99 mEq/L (ref 96–112)
GFR calc non Af Amer: >90 mL/min (ref >90)
Glucose, Bld: 120 mg/dL — ABNORMAL HIGH (ref 70–99)
Potassium: 4.1 mEq/L (ref 3.7–5.3)
Sodium: 138 mEq/L (ref 137–147)

## 2014-01-16 MED ORDER — INFLUENZA VAC SPLIT QUAD 0.5 ML IM SUSY
0.5000 mL | PREFILLED_SYRINGE | INTRAMUSCULAR | Status: AC
  Administered 2014-01-18: 0.5 mL via INTRAMUSCULAR
  Filled 2014-01-16 (×2): qty 0.5

## 2014-01-16 NOTE — Progress Notes (Signed)
   Daily Progress Note  Assessment/Planning: POD #2 s/p s/p TEVAR for DTA transection   On CXR Stent graft demonstrates NO infolding and clinically patient appears to be without any complications  Maintain normotensive to decrease chances of stent infolding  No need for L SCA to CCA transposition or bypass at this point  Will continue to follow  Subjective  - 2 Days Post-Op  Feeling better today  Objective Filed Vitals:   01/16/14 0500 01/16/14 0600 01/16/14 0700 01/16/14 0824  BP:  125/63    Pulse: 88 89 94   Temp:    98.8 F (37.1 C)  TempSrc:    Oral  Resp: Height:      Weight:      SpO2: 98% 99% 100%     Intake/Output Summary (Last 24 hours) at 01/16/14 0825 Last data filed at 01/16/14 0700  Gross per 24 hour  Intake 2186.25 ml  Output   5875 ml  Net -3688.75 ml   NEURO able to move all extremities spont., CN intact  PULM  BLL rales, L chest tube in place, AM pCXR pending  CV  RRR  GI  soft, NTND  VASC  R groin soft without hematoma, R DP easily palpable, CR in L hand <2 sec, well perfused hand   Laboratory CBC    Component Value Date/Time   WBC 10.5 01/16/2014 0430   HGB 9.3* 01/16/2014 0430   HCT 26.2* 01/16/2014 0430   PLT 103* 01/16/2014 0430    BMET    Component Value Date/Time   NA 138 01/16/2014 0430   K 4.1 01/16/2014 0430   CL 99 01/16/2014 0430   CO2 31 01/16/2014 0430   GLUCOSE 120* 01/16/2014 0430   BUN 10 01/16/2014 0430   CREATININE 0.83 01/16/2014 0430   CALCIUM 8.8 01/16/2014 0430   GFRNONAA >90 01/16/2014 0430   GFRAA >90 01/16/2014 0430    Leonides Sake, MD [facility] Office: [phone] Pager: [phone]  01/16/2014, 8:25 AM

## 2014-01-16 NOTE — Progress Notes (Signed)
Patient ID: Kenneth Franco, male   DOB: [birth year], 28 y.o.   MRN: [id number] TCTS DAILY ICU PROGRESS NOTE                   [address]            [place] [address]          [phone]   2 Days Post-Op Procedure(s) (LRB): ENDOVASCULAR STENT GRAFT INSERTION (N/A) ESOPHAGOGASTRODUODENOSCOPY (EGD) (N/A)  Total Length of Stay:  LOS: 2 days   Subjective: Alert and feels well denies sob  Objective: Vital signs in last 24 hours: Temp:  [98.1 F (36.7 C)-98.8 F (37.1 C)] 98.8 F (37.1 C) (09/18 0402) Pulse Rate:  [79-103] 97 (09/18 0400) Cardiac Rhythm:  [-] Normal sinus rhythm (09/18 0000) Resp:  [0-32] 20 (09/18 0400) BP: (118-139)/(55-105) 133/67 mmHg (09/18 0200) SpO2:  [93 %-98 %] 95 % (09/18 0400) Arterial Line BP: (156-162)/(63-66) 162/63 mmHg (09/17 1000)  Filed Weights   01/14/14 1445 01/14/14 2300  Weight: 210 lb (95.255 kg) 211 lb 9.6 oz (95.981 kg)    Weight change:    Hemodynamic parameters for last 24 hours:    Intake/Output from previous day: 09/17 0701 - 09/18 0700 In: 2136.3 [P.O.:480; I.V.:1656.3] Out: 5675 [Urine:4700; Chest Tube:975]  Intake/Output this shift:    Current Meds: Scheduled Meds: . enoxaparin (LOVENOX) injection  40 mg Subcutaneous Q24H  . metoprolol tartrate  12.5 mg Oral BID  . pantoprazole  40 mg Oral Daily   Or  . pantoprazole (PROTONIX) IV  40 mg Intravenous Daily   Continuous Infusions: . dextrose 5 % and 0.45% NaCl 1,000 mL infusion 75 mL/hr at 01/15/14 0815   PRN Meds:.fentaNYL, fentaNYL, guaiFENesin-dextromethorphan, hydrALAZINE, labetalol, metoprolol, morphine injection, ondansetron (ZOFRAN) IV, ondansetron, ondansetron, phenol, potassium chloride, traMADol  General appearance: alert and cooperative Neurologic: intact Heart: regular rate and rhythm, S1, S2 normal, no murmur, click, rub or gallop Lungs: diminished breath sounds LUL Abdomen: soft, non-tender; bowel sounds normal; no masses,  no  organomegaly Extremities: extremities normal, atraumatic, no cyanosis or edema and Homans sign is negative, no sign of DVT Wound: no air leak from ct Lower extremities strength and function intact  Lab Results: CBC: Recent Labs  01/15/14 1515 01/16/14 0430  WBC 11.9* 10.5  HGB 10.0* 9.3*  HCT 28.1* 26.2*  PLT 110* 103*   BMET:  Recent Labs  01/15/14 0410 01/16/14 0430  NA 138 138  K 4.9 4.1  CL 99 99  CO2 22 31  GLUCOSE 186* 120*  BUN 16 10  CREATININE 0.98 0.83  CALCIUM 8.4 8.8    PT/INR:  Recent Labs  01/14/14 2000  LABPROT 16.1*  INR 1.29   Radiology:  No results found. xray today still pending  Assessment/Plan: S/P Procedure(s) (LRB): ENDOVASCULAR STENT GRAFT INSERTION (N/A) ESOPHAGOGASTRODUODENOSCOPY (EGD) (N/A) Mobilize Chest xray pending today , after done consider d/c chest tube    Kenneth Franco B 01/16/2014 7:05 AM

## 2014-01-16 NOTE — Plan of Care (Signed)
Problem: Consults Goal: Vascular Cath Int Patient Education (See Patient Education module for education specifics.)  Outcome: Completed/Met Date Met:  01/16/14 After surgery d/t emergency   Problem: Phase I Progression Outcomes Goal: Distal pulses equal to baseline Outcome: Progressing L radial thready to palpation but dopplarable
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
## 2014-01-16 NOTE — Progress Notes (Signed)
PT Cancellation Note  Patient Details Name: Kenneth Franco MRN: [id number] DOB: June 25, 1985   Cancelled Treatment:    Reason Eval/Treat Not Completed: Other (comment) (Pt just back from amb with nursing)   [facility] 01/16/2014, 3:14 PM
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
## 2014-01-17 ENCOUNTER — Inpatient Hospital Stay (HOSPITAL_COMMUNITY)

## 2014-01-17 LAB — CBC
HCT: 24.8 % — ABNORMAL LOW (ref 39.0–52.0)
HEMOGLOBIN: 8.9 g/dL — AB (ref 13.0–17.0)
MCH: 30.6 pg (ref 26.0–34.0)
MCHC: 35.9 g/dL (ref 30.0–36.0)
MCV: 85.2 fL (ref 78.0–100.0)
Platelets: 101 10*3/uL — ABNORMAL LOW (ref 150–400)
RBC: 2.91 MIL/uL — ABNORMAL LOW (ref 4.22–5.81)
RDW: 12.2 % (ref 11.5–15.5)
WBC: 8.5 10*3/uL (ref 4.0–10.5)

## 2014-01-17 MED ORDER — HYDROCODONE-ACETAMINOPHEN 10-325 MG PO TABS
1.0000 | ORAL_TABLET | ORAL | Status: DC | PRN
  Administered 2014-01-17: 1 via ORAL
  Administered 2014-01-18 – 2014-01-22 (×24): 2 via ORAL
  Filled 2014-01-17 (×11): qty 2
  Filled 2014-01-17: qty 1
  Filled 2014-01-17 (×13): qty 2

## 2014-01-17 MED ORDER — MORPHINE SULFATE 2 MG/ML IJ SOLN
2.0000 mg | INTRAMUSCULAR | Status: DC | PRN
  Administered 2014-01-17 – 2014-01-20 (×6): 2 mg via INTRAVENOUS
  Filled 2014-01-17 (×6): qty 1

## 2014-01-17 MED ORDER — ENOXAPARIN SODIUM 30 MG/0.3ML ~~LOC~~ SOLN
30.0000 mg | Freq: Two times a day (BID) | SUBCUTANEOUS | Status: DC
  Administered 2014-01-17 – 2014-01-22 (×10): 30 mg via SUBCUTANEOUS
  Filled 2014-01-17 (×18): qty 0.3

## 2014-01-17 MED ORDER — OXYCODONE HCL 5 MG PO TABS
5.0000 mg | ORAL_TABLET | ORAL | Status: DC | PRN
  Filled 2014-01-17: qty 3

## 2014-01-17 NOTE — Progress Notes (Signed)
Dr. Arbie Cookey notified that patient's left radial pulse can only be dopplered, cap refill <2 seconds. Was notified that this is expected. Will continue to monitor.

## 2014-01-17 NOTE — Progress Notes (Addendum)
       [address]       [name] [address]             [phone]          3 Days Post-Op Procedure(s) (LRB): ENDOVASCULAR STENT GRAFT INSERTION (N/A) ESOPHAGOGASTRODUODENOSCOPY (EGD) (N/A)  Subjective: C/o shoulder discomfort with movement and mouth pain when trying to eat.   Objective: Vital signs in last 24 hours: Patient Vitals for the past 24 hrs:  BP Temp Temp src Pulse Resp SpO2 Height Weight  01/17/14 0330 133/62 mmHg 98.9 F (37.2 C) Oral 85 18 93 % - -  01/16/14 2315 139/56 mmHg 97.4 F (36.3 C) Oral 73 18 96 % - -  01/16/14 2145 138/65 mmHg 99.1 F (37.3 C) Oral 95 12 96 % 6' (1.829 m) 202 lb 9.6 oz (91.9 kg)  01/16/14 2000 - 98.4 F (36.9 C) Oral - - - - -  01/16/14 1640 142/60 mmHg 98.6 F (37 C) Oral - - - - -  01/16/14 1600 - - - - 17 95 % - -  01/16/14 1200 135/64 mmHg - - 96 0 96 % - -  01/16/14 1127 - 98.4 F (36.9 C) Oral - - - - -  01/16/14 1100 - - - 80 11 99 % - -  01/16/14 1000 144/66 mmHg - - 88 20 96 % - -  01/16/14 0900 - - - 91 11 100 % - -  01/16/14 0824 - 98.8 F (37.1 C) Oral - - - - -  01/16/14 0800 - - - 101 26 100 % - -   Current Weight  01/16/14 202 lb 9.6 oz (91.9 kg)     Intake/Output from previous day: 09/18 0701 - 09/19 0700 In: 2066.3 [P.O.:540; I.V.:1526.3] Out: 3245 [Urine:2975; Chest Tube:270]    PHYSICAL EXAM:  Heart: RRR Lungs: Diminished BS L base Chest tube: No air leak with cough Extremities: Warm, well perfused, no groin hematoma    Lab Results: CBC: Recent Labs  01/16/14 1530 01/17/14 0013  WBC 10.4 8.5  HGB 9.0* 8.9*  HCT 24.8* 24.8*  PLT 169 101*   BMET:  Recent Labs  01/15/14 0410 01/16/14 0430  NA 138 138  K 4.9 4.1  CL 99 99  CO2 22 31  GLUCOSE 186* 120*  BUN 16 10  CREATININE 0.98 0.83  CALCIUM 8.4 8.8    PT/INR:  Recent Labs  01/14/14 2000  LABPROT 16.1*  INR 1.29      Assessment/Plan: S/P Procedure(s) (LRB): ENDOVASCULAR STENT GRAFT  INSERTION (N/A) ESOPHAGOGASTRODUODENOSCOPY (EGD) (N/A) CT output decreasing, 250 ml over past 24 hrs.  No air leak with cough.  CXR stable.  Hopefully can d/c CT soon. Expected post op blood loss anemia- H/H generally stable. CV- BPs trending up, may need to increase beta blocker dose. Continue pulm toilet, PT/OT. Pain, limited ROM in L shoulder- continue PT,may need ortho consult.   LOS: 3 days    Tajuana Kniskern H 01/17/2014

## 2014-01-17 NOTE — Progress Notes (Signed)
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 results.   Electronically Signed   By: Christiana Pellant M.D.   On: 01/14/2014 15:51   Anti-infectives: Anti-infectives   Start     Dose/Rate Route Frequency Ordered Stop   01/14/14 2200  cefUROXime (ZINACEF) 1.5 g in dextrose 5 % 50 mL IVPB     1.5 g 100 mL/hr over 30 Minutes Intravenous Every 12 hours 01/14/14 2137 01/15/14 1030      Assessment/Plan: s/p Procedure(s) with comments: ENDOVASCULAR STENT GRAFT INSERTION (N/A) - Thorasic Endovascular Stent. ESOPHAGOGASTRODUODENOSCOPY (EGD) (N/A) Able overall. Continue to mobilize   LOS: 3 days   Kyros Salzwedel 01/17/2014, 9:07 AM

## 2014-01-17 NOTE — Progress Notes (Signed)
Stable to floor.  Remove central line.
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
## 2014-01-17 NOTE — Progress Notes (Signed)
Patient being transferred to 6N07 via w/c by Reece Mcbroom, RN with patient's family at bedside. Belongings sent with patient and family. Report given by dayshift RN to [facility] RN

## 2014-01-18 ENCOUNTER — Inpatient Hospital Stay (HOSPITAL_COMMUNITY)

## 2014-01-18 DIAGNOSIS — S2242XA Multiple fractures of ribs, left side, initial encounter for closed fracture: Secondary | ICD-10-CM | POA: Diagnosis present

## 2014-01-18 DIAGNOSIS — D62 Acute posthemorrhagic anemia: Secondary | ICD-10-CM | POA: Diagnosis not present

## 2014-01-18 DIAGNOSIS — S27321A Contusion of lung, unilateral, initial encounter: Secondary | ICD-10-CM | POA: Diagnosis present

## 2014-01-18 DIAGNOSIS — S272XXA Traumatic hemopneumothorax, initial encounter: Secondary | ICD-10-CM | POA: Diagnosis present

## 2014-01-18 NOTE — Progress Notes (Signed)
       [address]       [name] [address]             [phone]          4 Days Post-Op Procedure(s) (LRB): ENDOVASCULAR STENT GRAFT INSERTION (N/A) ESOPHAGOGASTRODUODENOSCOPY (EGD) (N/A)  Subjective: No new issues.  Objective: Vital signs in last 24 hours: Patient Vitals for the past 24 hrs:  BP Temp Temp src Pulse Resp SpO2 Height Weight  01/18/14 0540 145/78 mmHg 98.7 F (37.1 C) Oral 88 17 97 % - -  01/18/14 0134 131/55 mmHg 98.4 F (36.9 C) Oral 71 16 97 % - -  01/17/14 2059 128/58 mmHg 98.2 F (36.8 C) Oral 86 17 97 % 6' (1.829 m) 196 lb 3.4 oz (89 kg)  01/17/14 2022 130/63 mmHg 99 F (37.2 C) Oral 91 17 90 % - -  01/17/14 1603 - 98.9 F (37.2 C) Oral - - - - -  01/17/14 1150 133/62 mmHg - - 84 15 94 % - -   Current Weight  01/17/14 196 lb 3.4 oz (89 kg)     Intake/Output from previous day: 09/19 0701 - 09/20 0700 In: 1210 [P.O.:1060; I.V.:150] Out: 3780 [Urine:3500; Chest Tube:280]    PHYSICAL EXAM: Heart: RRR  Lungs: Diminished BS L base  Chest tube: No air leak with cough  Extremities: Warm, well perfused, no groin hematoma      Lab Results: CBC: Recent Labs  01/16/14 1530 01/17/14 0013  WBC 10.4 8.5  HGB 9.0* 8.9*  HCT 24.8* 24.8*  PLT 169 101*   BMET:  Recent Labs  01/16/14 0430  NA 138  K 4.1  CL 99  CO2 31  GLUCOSE 120*  BUN 10  CREATININE 0.83  CALCIUM 8.8    PT/INR: No results found for this basename: LABPROT, INR,  in the last 72 hours    Assessment/Plan: S/P Procedure(s) (LRB): ENDOVASCULAR STENT GRAFT INSERTION (N/A) ESOPHAGOGASTRODUODENOSCOPY (EGD) (N/A) CT output 280 ml/past 24 hrs.  Continue CT to water seal for now. Continue current care per trauma/VVS.   LOS: 4 days    Bora Broner H 01/18/2014
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
## 2014-01-18 NOTE — Progress Notes (Signed)
Patient ID: Kenneth Franco, male   DOB: [birth year], 28 y.o.   MRN: [id number] Comfortable. No complaints Still has air leak and his chest tube Left arm warm and well perfused. No palpable pulses. No ischemic changes.  Stable overall status post TVAR

## 2014-01-18 NOTE — Progress Notes (Signed)
Trauma service  I've seen, interviewed, and examined this patient. I agree with the assessment treatment plan outlined by Mr. Tinnie Gens, [place].  Chest x-ray is looks good. No air leak but still with drainage. No abdominal complaints. Ambulating. Tolerating diet.  Angelia Mould. Derrell Lolling, M.D., [facility]

## 2014-01-18 NOTE — Progress Notes (Signed)
Pt tx 6N per Md order, pt VSS, family at [facility], pt and family verbalized understanding of tx, report called to receiving RN all questions answered, pt to tx with night shift, 3S night RN updated for tx

## 2014-01-19 ENCOUNTER — Inpatient Hospital Stay (HOSPITAL_COMMUNITY)

## 2014-01-19 DIAGNOSIS — S4992XA Unspecified injury of left shoulder and upper arm, initial encounter: Secondary | ICD-10-CM | POA: Diagnosis present
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
## 2014-01-19 NOTE — Progress Notes (Signed)
   Daily Progress Note  Assessment/Planning: POD #5 s/p TEVAR for DTA transection   On CXR Stent graft demonstrates NO infolding and clinically patient appears to be without any complications   Maintain normotensive to decrease chances of stent infolding   No need for L SCA to CCA transposition or bypass at this point   Will continue to follow   Subjective  - 5 Days Post-Op  No chest pain   Objective Filed Vitals:   01/18/14 2147 01/19/14 0201 01/19/14 0605 01/19/14 0940  BP: 134/64 133/57 133/58 141/60  Pulse: 77 74 82 85  Temp: 98.7 F (37.1 C) 98.2 F (36.8 C) 98.3 F (36.8 C) 99 F (37.2 C)  TempSrc: Oral   Oral  Resp: Height:      Weight:      SpO2: 98% 96% 96% 98%    Intake/Output Summary (Last 24 hours) at 01/19/14 0955 Last data filed at 01/19/14 0617  Gross per 24 hour  Intake    480 ml  Output    770 ml  Net   -290 ml   NEURO L hand grip 5/5, CN grossly intact PULM  CTAB, L CT to water seal CV  RRR GI  soft, NTND VASC  R groin soft, without hematoma, R DP easily palpable, well perfused L hand with CR <2 sec  Laboratory CBC    Component Value Date/Time   WBC 8.5 01/17/2014 0013   HGB 8.9* 01/17/2014 0013   HCT 24.8* 01/17/2014 0013   PLT 101* 01/17/2014 0013    BMET    Component Value Date/Time   NA 138 01/16/2014 0430   K 4.1 01/16/2014 0430   CL 99 01/16/2014 0430   CO2 31 01/16/2014 0430   GLUCOSE 120* 01/16/2014 0430   BUN 10 01/16/2014 0430   CREATININE 0.83 01/16/2014 0430   CALCIUM 8.8 01/16/2014 0430   GFRNONAA >90 01/16/2014 0430   GFRAA >90 01/16/2014 0430    Leonides Sake, MD [facility] Office: [phone] Pager: [phone]  01/19/2014, 9:55 AM
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
## 2014-01-19 NOTE — Progress Notes (Signed)
UR completed.  Pt denies needs for [facility] or DME at d/c.   Carlyle Lipa, RN BSN MHA CCM Trauma/Neuro ICU Case Manager [phone]

## 2014-01-19 NOTE — Progress Notes (Signed)
Patient examined and I agree with the assessment and plan. Ortho eval of muscular injuries L shoulder. I spoke with his wife and parents.  [name], MD, MPH, FACS Trauma: [phone] General Surgery: [phone]  01/19/2014 11:18 AM

## 2014-01-20 ENCOUNTER — Inpatient Hospital Stay (HOSPITAL_COMMUNITY)

## 2014-01-20 MED ORDER — METHOCARBAMOL 1000 MG/10ML IJ SOLN
1000.0000 mg | Freq: Four times a day (QID) | INTRAVENOUS | Status: DC | PRN
  Administered 2014-01-21: 1000 mg via INTRAVENOUS
  Filled 2014-01-20 (×2): qty 10

## 2014-01-20 MED ORDER — DOCUSATE SODIUM 100 MG PO CAPS
100.0000 mg | ORAL_CAPSULE | Freq: Two times a day (BID) | ORAL | Status: DC
  Administered 2014-01-20 (×2): 100 mg via ORAL
  Filled 2014-01-20 (×2): qty 1

## 2014-01-20 MED ORDER — POLYETHYLENE GLYCOL 3350 17 G PO PACK
17.0000 g | PACK | Freq: Every day | ORAL | Status: DC
  Administered 2014-01-20 – 2014-01-22 (×3): 17 g via ORAL
  Filled 2014-01-20 (×4): qty 1

## 2014-01-20 NOTE — Progress Notes (Signed)
Working with PT  This patient has been seen and I agree with the findings and treatment plan.  Marta Lamas. Gae Bon, MD, FACS [phone] (pager) [phone] (direct pager) Trauma Surgeon]
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
## 2014-01-20 NOTE — Progress Notes (Addendum)
OT Cancellation Note  Patient Details Name: Kenneth Franco MRN: [id number] DOB: 09-05-1985   Cancelled Treatment:    Reason Eval/Treat Not Completed: Other (comment). Awaiting orders for any AROM / PROM education for LT UE.   Earlie Raveling OTR/L [phone] 01/20/2014, 8:49 AM

## 2014-01-20 NOTE — Progress Notes (Signed)
Pt sleeping Reviewed mri shoulder On lovenox Partial deltoid and trap detatchment on mri -   Does not look operative Will examine him in am - currently midnight

## 2014-01-20 NOTE — Progress Notes (Signed)
Orthopedic Tech Progress Note Patient Details:  Kenneth Franco [birth year] [id number] Applied sling and swathe to LUE.  Pt. verbalized pain relief immediately after immobilization. Ortho Devices Type of Ortho Device: Sling immobilizer Ortho Device/Splint Location: LUE Ortho Device/Splint Interventions: Application   Lesle Chris 01/20/2014, 1:15 PM

## 2014-01-20 NOTE — Progress Notes (Addendum)
  Progress Note    01/20/2014 9:28 AM 6 Days Post-Op  Subjective:  Ready for CT to come out!  Tm 99.1 now afebrile HR 70's-80's regular 120's-130's systolic 96% RA  Filed Vitals:   01/20/14 0500  BP: 130/54  Pulse: 78  Temp: 98.1 F (36.7 C)  Resp: 16    Physical Exam: Cardiac:  regular Lungs:  Non labored Incisions:  Right groin is soft without hematoma Extremities:  2+ palpable right DP; left hand is well perfused.  CBC    Component Value Date/Time   WBC 8.5 01/17/2014 0013   RBC 2.91* 01/17/2014 0013   HGB 8.9* 01/17/2014 0013   HCT 24.8* 01/17/2014 0013   PLT 101* 01/17/2014 0013   MCV 85.2 01/17/2014 0013   MCH 30.6 01/17/2014 0013   MCHC 35.9 01/17/2014 0013   RDW 12.2 01/17/2014 0013    BMET    Component Value Date/Time   NA 138 01/16/2014 0430   K 4.1 01/16/2014 0430   CL 99 01/16/2014 0430   CO2 31 01/16/2014 0430   GLUCOSE 120* 01/16/2014 0430   BUN 10 01/16/2014 0430   CREATININE 0.83 01/16/2014 0430   CALCIUM 8.8 01/16/2014 0430   GFRNONAA >90 01/16/2014 0430   GFRAA >90 01/16/2014 0430    INR    Component Value Date/Time   INR 1.29 01/14/2014 2000     Intake/Output Summary (Last 24 hours) at 01/20/14 4098 Last data filed at 01/20/14 0840  Gross per 24 hour  Intake    240 ml  Output      0 ml  Net    240 ml     Assessment:  28 y.o. male is s/p:  TEVAR for DTA transection  6 Days Post-Op  Plan: -pt doing well this am -BP and HR tolerating low dose lopressor -left hand is well perfused.  Dr. Imogene Burn does not anticipate the need for left SCA to CCA bypass at this point -CT management per Trauma  Doreatha Massed, PA-C [facility] [phone] 01/20/2014 9:28 AM   Addendum  I have independently interviewed and examined the patient, and I agree with the physician assistant's findings.    Leonides Sake, MD [facility] [place] Office: [phone] Pager: [phone]  01/20/2014, 9:56 AM

## 2014-01-21 ENCOUNTER — Inpatient Hospital Stay (HOSPITAL_COMMUNITY)

## 2014-01-21 LAB — CREATININE, SERUM: Creatinine, Ser: 0.95 mg/dL (ref 0.50–1.35)

## 2014-01-21 MED ORDER — DOCUSATE SODIUM 100 MG PO CAPS
200.0000 mg | ORAL_CAPSULE | Freq: Two times a day (BID) | ORAL | Status: DC
  Administered 2014-01-21 – 2014-01-22 (×3): 200 mg via ORAL
  Filled 2014-01-21 (×4): qty 2

## 2014-01-21 MED ORDER — PREGABALIN 75 MG PO CAPS
75.0000 mg | ORAL_CAPSULE | Freq: Two times a day (BID) | ORAL | Status: DC
  Administered 2014-01-21 – 2014-01-22 (×3): 75 mg via ORAL
  Filled 2014-01-21 (×3): qty 1
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
## 2014-01-21 NOTE — Progress Notes (Signed)
Patient still h as too much output for CT top come out.  Will re-evaluate tomorrow.  This patient has been seen and I agree with the findings and treatment plan.  Marta Lamas. Gae Bon, MD, FACS [phone] (pager) [phone] (direct pager) Trauma Surgeon

## 2014-01-21 NOTE — Progress Notes (Addendum)
     Subjective  - Doing better over all, just wants CT out as soon as they can.   Objective 124/56 66 98.3 F (36.8 C) (Oral) 16 99%  Intake/Output Summary (Last 24 hours) at 01/21/14 0806 Last data filed at 01/21/14 1610  Gross per 24 hour  Intake   1160 ml  Output    200 ml  Net    960 ml   Palpable left radial pulse, sensation intact AROM of all 4 extremities Right groin without hematoma    Assessment/Planning: POD #7 No new complaints Left upper extremity well perfused CT per trauma they do not anticipate D/C today  Dr. Imogene Burn does not anticipate the need for left SCA to CCA bypass at this point    [facility] 01/21/2014 8:06 AM --  Laboratory Lab Results: No results found for this basename: WBC, HGB, HCT, PLT,  in the last 72 hours BMET  Recent Labs  01/21/14 0408  CREATININE 0.95    COAG Lab Results  Component Value Date   INR 1.29 01/14/2014   INR 1.10 01/14/2014   No results found for this basename: PTT    Addendum  I have independently interviewed and examined the patient, and I agree with the physician assistant's findings.  No change from vascular viewpoint.  TEVAR appears patent.  Leonides Sake, MD [facility] [place] Office: [phone] Pager: [phone]  01/21/2014, 8:25 AM

## 2014-01-22 ENCOUNTER — Other Ambulatory Visit: Payer: Self-pay | Admitting: *Deleted

## 2014-01-22 ENCOUNTER — Inpatient Hospital Stay (HOSPITAL_COMMUNITY)

## 2014-01-22 DIAGNOSIS — Z48812 Encounter for surgical aftercare following surgery on the circulatory system: Secondary | ICD-10-CM

## 2014-01-22 DIAGNOSIS — I714 Abdominal aortic aneurysm, without rupture, unspecified: Secondary | ICD-10-CM

## 2014-01-22 MED ORDER — METOPROLOL TARTRATE 25 MG PO TABS: 25.0000 mg | ORAL_TABLET | Freq: Two times a day (BID) | ORAL | Status: DC

## 2014-01-22 MED ORDER — DSS 100 MG PO CAPS: 200.0000 mg | ORAL_CAPSULE | Freq: Two times a day (BID) | ORAL | Status: AC | PRN

## 2014-01-22 MED ORDER — HYDROCODONE-ACETAMINOPHEN 10-325 MG PO TABS: 0.5000 | ORAL_TABLET | Freq: Four times a day (QID) | ORAL | Status: AC | PRN

## 2014-01-22 MED ORDER — METOPROLOL TARTRATE 25 MG PO TABS: 25.0000 mg | ORAL_TABLET | Freq: Two times a day (BID) | ORAL | Status: AC

## 2014-01-22 MED ORDER — HYDROCODONE-ACETAMINOPHEN 10-325 MG PO TABS: 1.0000 | ORAL_TABLET | Freq: Four times a day (QID) | ORAL | Status: DC | PRN

## 2014-01-22 MED ORDER — POLYETHYLENE GLYCOL 3350 17 G PO PACK: 17.0000 g | PACK | Freq: Every day | ORAL | Status: AC | PRN

## 2014-01-22 MED ORDER — METOPROLOL TARTRATE 12.5 MG HALF TABLET: 12.5000 mg | ORAL_TABLET | Freq: Two times a day (BID) | ORAL | Status: DC
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
## 2014-01-22 NOTE — Discharge Instructions (Addendum)
Orthopedic surgery recommendations: 1.  OK to use sling on left arm for comfort.   2.  Follow up with Ortho within 2 weeks.   3.  Other recs:  Impression left shoulder soft tissue injury which cannot really be improved with surgical intervention. Rotator cuff is intact. A.c. joint is stable and intact. Patient did have some deltoid and trapezial detachment from the distal clavicle on MRI scanning however it is a partial detachment and I think he'll do well with therapy progress range of motion and activity. We'll provide for him a sling for comfort. OK to start OT/PT for at least pendulum exercises.   Dr. Cammy Copa [address] [place] [address] [phone]   Vascular surgery recommendations: After two weeks, the right common femoral artery puncture should be healed 1. No heavy lifting (>15 lb) until Sept 30  2. Maintain normotensive BP (<130/90) as much as possible to help keep the thoracic endograft open  Pt will need subsequent follow up with a vascular surgeon in one month with a chest CTA to check on patency of the thoracic endograft. I suspect the [employer] would prefer this to be with an Proofreader near his home base. As I am not an Conservation officer, historic buildings, I would defer any long-term medical board issues to a Proofreader.  3.  Follow up with vascular surgery within 4 weeks.    Leonides Sake, MD  [facility]  [address] [place] [address] Office: [phone]
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
## 2014-01-22 NOTE — Progress Notes (Signed)
UR completed. Pt will be provided medical information and imaging on disk to [facility] for continued follow up per his request and that of the medical team here.   Carlyle Lipa, RN BSN MHA CCM Trauma/Neuro ICU Case Manager [phone]
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
## 2014-01-22 NOTE — Discharge Summary (Signed)
Aquinnah Devin, MD, MPH, FACS Trauma: [phone] General Surgery: [phone]  

## 2014-01-22 NOTE — Progress Notes (Signed)
Discussed discharge summary with pt.  All questions answered. VSS.  IV removed, site clean, dry and intact.  Pt. Received prescriptions.  Instructed to follow up with orthopedics and vascular surgery in [place] or on base.  Chest tube site dressing dry and intact.

## 2014-01-22 NOTE — Progress Notes (Addendum)
  [facility] Progress Note  01/22/2014 11:18 AM 8 Days Post-Op  Subjective:  Doing well today. Anxious to go home.   Tmax 98.8 BP sys 120s-140s 02 99%  Filed Vitals:   01/22/14 1031  BP: 146/78  Pulse: 78  Temp:   Resp:     Physical Exam: Weak left radial pulse. 5/5 left grip strength 5/5 strength upper and lower extremities.   CBC    Component Value Date/Time   WBC 8.5 01/17/2014 0013   RBC 2.91* 01/17/2014 0013   HGB 8.9* 01/17/2014 0013   HCT 24.8* 01/17/2014 0013   PLT 101* 01/17/2014 0013   MCV 85.2 01/17/2014 0013   MCH 30.6 01/17/2014 0013   MCHC 35.9 01/17/2014 0013   RDW 12.2 01/17/2014 0013    BMET    Component Value Date/Time   NA 138 01/16/2014 0430   K 4.1 01/16/2014 0430   CL 99 01/16/2014 0430   CO2 31 01/16/2014 0430   GLUCOSE 120* 01/16/2014 0430   BUN 10 01/16/2014 0430   CREATININE 0.95 01/21/2014 0408   CALCIUM 8.8 01/16/2014 0430   GFRNONAA >90 01/21/2014 0408   GFRAA >90 01/21/2014 0408    INR    Component Value Date/Time   INR 1.29 01/14/2014 2000     Intake/Output Summary (Last 24 hours) at 01/22/14 1118 Last data filed at 01/22/14 0453  Gross per 24 hour  Intake    240 ml  Output    175 ml  Net     65 ml     Assessment:  28 y.o. male is s/p: TEVAR for DTA transection  8 Days Post-Op  Plan: -Doing well from vascular standpoint. -Possible discharge today if chest tube is removed. -Discussed that he needs to refrain from any heavy lifting for a minimum of 4 weeks and maintain good blood pressure control.  -He will need to follow up in 4 weeks with Dr. Imogene Burn with a CTA.    Maris Berger, PA-C [facility] Office: [phone] Pager: [phone] 01/22/2014 11:18 AM  Addendum  I have independently interviewed and examined the patient, and I agree with the physician assistant's findings.  After two weeks, the right common femoral artery puncture should be healed.  Subsequently, I  recommend:  1.  No heavy lifting (>15 lb) until Sept 30 2.  Maintain normotensive BP (<130/90) as much as possible to help keep the thoracic endograft open  Pt will need subsequent follow up with a vascular surgeon in one month with a chest CTA to check on patency of the thoracic endograft.  I suspect the [employer] would prefer this to be with an Proofreader near his home base.   As I am not an Conservation officer, historic buildings, I would defer any long-term medical board issues to a Proofreader.  Leonides Sake, MD [facility] [place] Office: [phone] Pager: [phone]  01/22/2014, 12:00 PM

## 2014-01-22 NOTE — Progress Notes (Signed)
CT out. Check CXR and hope for D/C. I also spoke with his wife. Patient examined and I agree with the assessment and plan  [name], MD, MPH, FACS Trauma: [phone] General Surgery: [phone]  01/22/2014 2:50 PM

## 2014-02-26 ENCOUNTER — Encounter: Payer: Self-pay | Admitting: Vascular Surgery

## 2014-02-27 ENCOUNTER — Ambulatory Visit (INDEPENDENT_AMBULATORY_CARE_PROVIDER_SITE_OTHER): Admitting: Vascular Surgery

## 2014-02-27 ENCOUNTER — Ambulatory Visit
Admission: RE | Admit: 2014-02-27 | Discharge: 2014-02-27 | Disposition: A | Discharge status: Home / Self Care | Source: Ambulatory Visit | Attending: Vascular Surgery | Admitting: Vascular Surgery

## 2014-02-27 ENCOUNTER — Other Ambulatory Visit: Payer: Self-pay | Admitting: *Deleted

## 2014-02-27 ENCOUNTER — Encounter: Payer: Self-pay | Admitting: Vascular Surgery

## 2014-02-27 VITALS — BP 131/61 | HR 63 | Ht 72.0 in | Wt 209.0 lb

## 2014-02-27 DIAGNOSIS — Z48812 Encounter for surgical aftercare following surgery on the circulatory system: Secondary | ICD-10-CM

## 2014-02-27 DIAGNOSIS — I712 Thoracic aortic aneurysm, without rupture, unspecified: Secondary | ICD-10-CM

## 2014-02-27 DIAGNOSIS — I714 Abdominal aortic aneurysm, without rupture, unspecified: Secondary | ICD-10-CM

## 2014-02-27 DIAGNOSIS — S2509XA Other specified injury of thoracic aorta, initial encounter: Secondary | ICD-10-CM | POA: Insufficient documentation

## 2014-02-27 DIAGNOSIS — S2509XD Other specified injury of thoracic aorta, subsequent encounter: Secondary | ICD-10-CM

## 2014-02-27 MED ORDER — IOHEXOL 350 MG/ML SOLN
65.0000 mL | Freq: Once | INTRAVENOUS | Status: AC | PRN
  Administered 2014-02-27: 65 mL via INTRAVENOUS

## 2014-02-27 NOTE — Addendum Note (Signed)
Addended by: Sharee PimpleMCCHESNEY, Cecilee Rosner K on: 02/27/2014 03:27 PM   Modules accepted: Orders
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
## 2014-03-11 ENCOUNTER — Other Ambulatory Visit: Payer: Self-pay | Admitting: *Deleted

## 2014-03-11 DIAGNOSIS — S2509XD Other specified injury of thoracic aorta, subsequent encounter: Secondary | ICD-10-CM

## 2014-03-11 DIAGNOSIS — Z48812 Encounter for surgical aftercare following surgery on the circulatory system: Secondary | ICD-10-CM

## 2014-03-18 ENCOUNTER — Encounter: Payer: Self-pay | Admitting: *Deleted

## 2014-08-05 ENCOUNTER — Telehealth: Payer: Self-pay

## 2014-08-05 NOTE — Telephone Encounter (Signed)
Phone call from pt.  Reported symptoms with intermittent tingling in left hand, and weakness in left arm that started approx. 1.5 mos. ago.  Stated the symptoms occur randomly with activity of exercising, or at rest.  He reported he started "power lifting exercises" in the gym, about 2 mos. prior to the onset of symptoms.  Reported it feels as if his arm goes to sleep, and lasts approx. 10".  Denies color change, temperature change, or pain in left arm/ hand.  Denies any increase in frequency or degree of symptoms from onset.  Stated he wanted to make Dr. Imogene Burnhen aware.  Will return call to pt. with any recommendations, after discussing with Dr. Imogene Burnhen.

## 2014-08-06 NOTE — Telephone Encounter (Signed)
RE: need for CTA sooner than annual f/u  Received: Yesterday    Fransisco HertzBrian L Chen, MD  Smitty Pluckarol S Mohanad Carsten, RN           Probably is presenting with left arm claudication equivalent sx due to coverage of his left subclavian artery as part of the TEVAR. He may need a left carotid to subclavian bypass. Given his distant location, I may be easier for him to get referred by his PCP to someone in his local community for such.     Attempted to call pt. At 9:30 AM regarding above recommendations by Dr. Imogene Burnhen.  Left voice message to call back.

## 2014-08-07 NOTE — Telephone Encounter (Signed)
Attempted to call pt again; left message to call office re: Dr. Nicky Pughhen's recommendations.

## 2014-08-10 NOTE — Telephone Encounter (Signed)
Received msg. through answering service.  Pt. called and left message on 08/07/14 @ 5:11 PM, that he rec'd our message, and is in process of looking for a vascular surgeon in his local area in [place].

## 2015-03-05 ENCOUNTER — Ambulatory Visit
Admission: RE | Admit: 2015-03-05 | Discharge: 2015-03-05 | Disposition: A | Discharge status: Home / Self Care | Source: Ambulatory Visit | Attending: Vascular Surgery | Admitting: Vascular Surgery

## 2015-03-05 ENCOUNTER — Ambulatory Visit: Admitting: Vascular Surgery

## 2015-03-05 DIAGNOSIS — Z48812 Encounter for surgical aftercare following surgery on the circulatory system: Secondary | ICD-10-CM

## 2015-03-05 DIAGNOSIS — S2509XD Other specified injury of thoracic aorta, subsequent encounter: Secondary | ICD-10-CM

## 2015-03-05 MED ORDER — IOPAMIDOL (ISOVUE-370) INJECTION 76%
75.0000 mL | Freq: Once | INTRAVENOUS | Status: AC | PRN
  Administered 2015-03-05: 75 mL via INTRAVENOUS

## 2015-03-16 ENCOUNTER — Encounter: Payer: Self-pay | Admitting: Vascular Surgery

## 2015-03-19 ENCOUNTER — Ambulatory Visit: Admitting: Vascular Surgery

## 2015-04-07 ENCOUNTER — Encounter: Payer: Self-pay | Admitting: Vascular Surgery

## 2015-04-09 ENCOUNTER — Encounter: Payer: Self-pay | Admitting: Vascular Surgery

## 2015-04-09 ENCOUNTER — Ambulatory Visit (INDEPENDENT_AMBULATORY_CARE_PROVIDER_SITE_OTHER): Admitting: Vascular Surgery

## 2015-04-09 VITALS — BP 135/67 | HR 66 | Temp 98.3°F | Resp 16 | Ht 73.0 in | Wt 240.0 lb

## 2015-04-09 DIAGNOSIS — S2509XD Other specified injury of thoracic aorta, subsequent encounter: Secondary | ICD-10-CM

## 2015-04-09 DIAGNOSIS — S2500XD Unspecified injury of thoracic aorta, subsequent encounter: Secondary | ICD-10-CM
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
## 2015-12-09 IMAGING — CR DG CHEST 1V PORT
1 series · 1 of 1 positions shown · non-contrast
Comparison: CT and chest x-ray on 01/14/2014

CLINICAL DATA: Postop for stent graft.  Central line insertion.

EXAM:
PORTABLE CHEST - 1 VIEW

[portable]
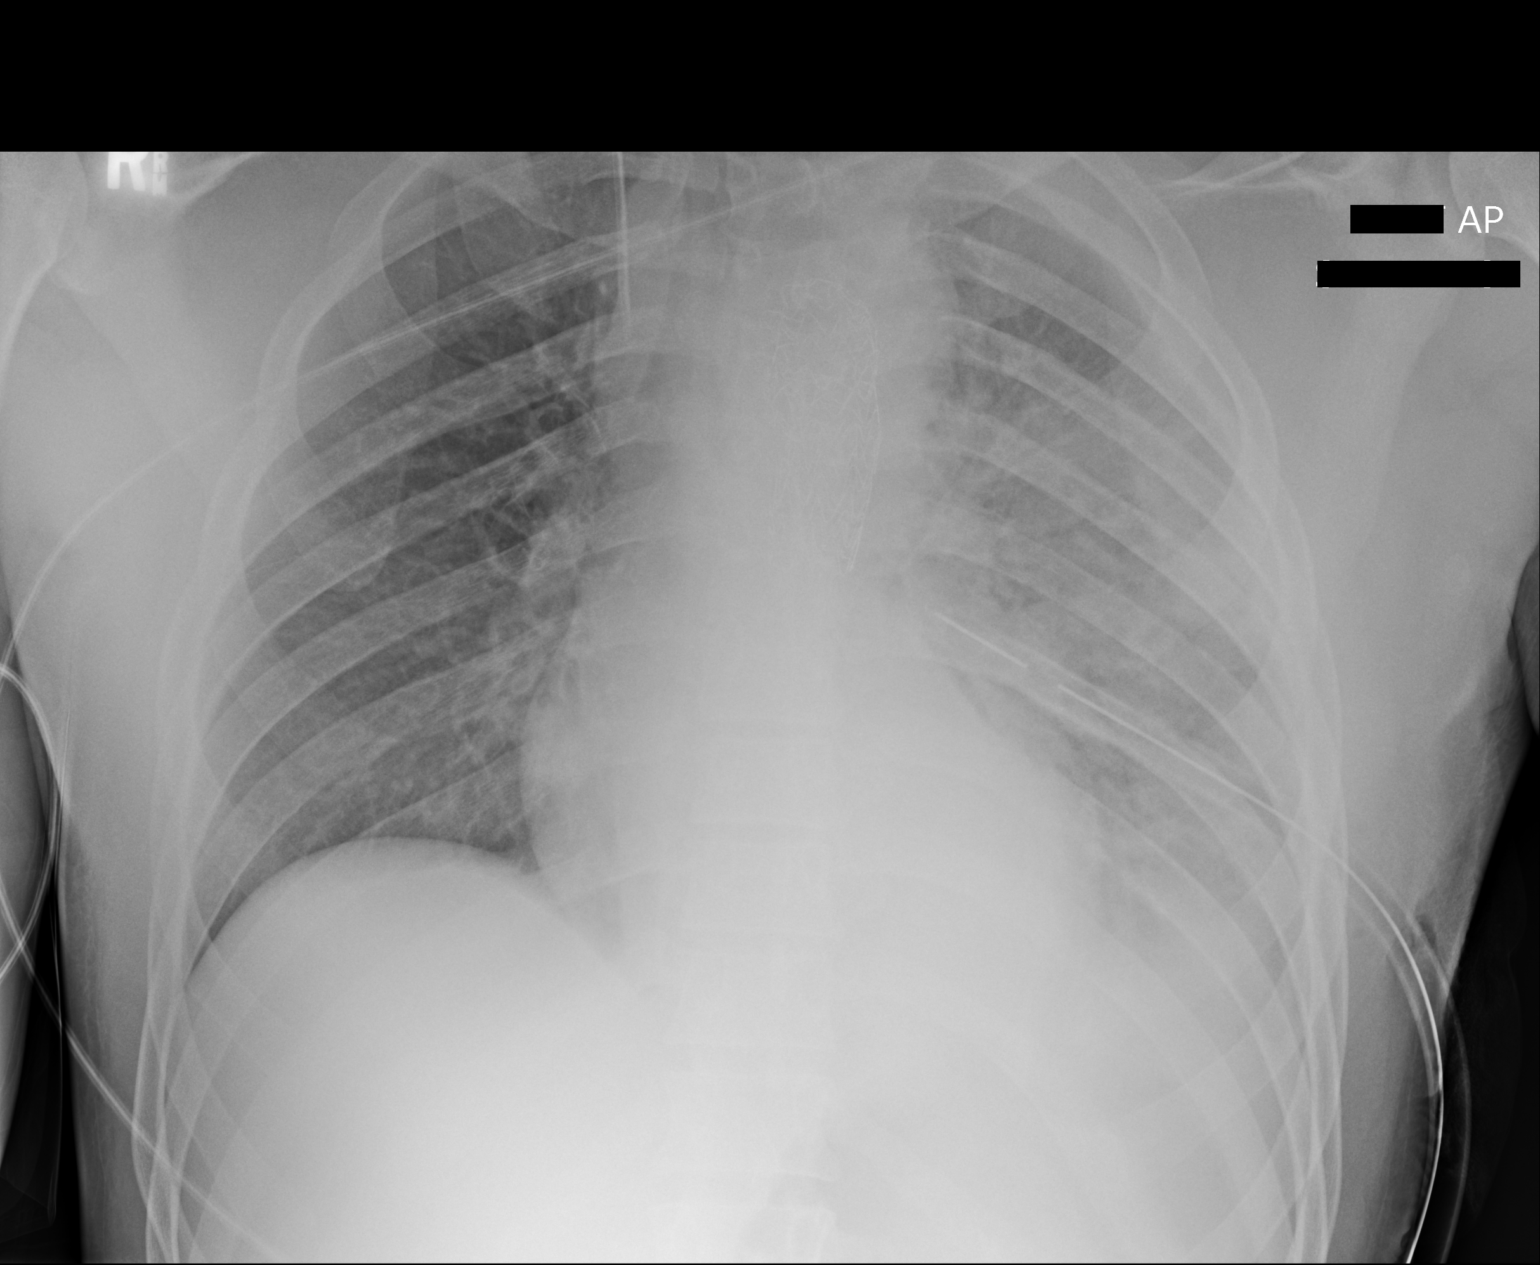

[1 of 1 positions shown; findings below may reference images not displayed]

FINDINGS: Right IJ central line tip overlies the superior vena cava. A left
chest tube is in place. An aortic stent graft is in place. There is
stable, widened appearance of the mediastinum. Left pleural effusion
is present. There has been development of significant left lung
opacity consistent with consolidation and/or atelectasis
particularly at the base. Right lung is essentially clear. No
definite pneumothorax.
IMPRESSION: 1. Interval placement of aortic stent graft.
2. Left pleural effusion and significant left lung opacity.
3. No pneumothorax.
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
